# Patient Record
Sex: Male | Born: 2000 | Race: Black or African American | Hispanic: No | Marital: Single | Smoking: Never smoker
Health system: Southern US, Community
[De-identification: ages and names within clinical notes are randomized; demographics above are authoritative.]

---
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
## 2022-08-22 ENCOUNTER — Emergency Department (HOSPITAL_BASED_OUTPATIENT_CLINIC_OR_DEPARTMENT_OTHER)
Admission: EM | Admit: 2022-08-22 | Discharge: 2022-08-22 | Disposition: A | Discharge status: Home / Self Care | Payer: Medicaid Other | Attending: Emergency Medicine | Admitting: Emergency Medicine

## 2022-08-22 ENCOUNTER — Emergency Department (HOSPITAL_BASED_OUTPATIENT_CLINIC_OR_DEPARTMENT_OTHER): Payer: Medicaid Other

## 2022-08-22 ENCOUNTER — Other Ambulatory Visit: Payer: Self-pay

## 2022-08-22 ENCOUNTER — Encounter (HOSPITAL_BASED_OUTPATIENT_CLINIC_OR_DEPARTMENT_OTHER): Payer: Self-pay | Admitting: *Deleted

## 2022-08-22 DIAGNOSIS — R8289 Other abnormal findings on cytological and histological examination of urine: Secondary | ICD-10-CM | POA: Insufficient documentation

## 2022-08-22 DIAGNOSIS — M546 Pain in thoracic spine: Secondary | ICD-10-CM | POA: Diagnosis not present

## 2022-08-22 DIAGNOSIS — R1011 Right upper quadrant pain: Secondary | ICD-10-CM | POA: Insufficient documentation

## 2022-08-22 DIAGNOSIS — M25511 Pain in right shoulder: Secondary | ICD-10-CM | POA: Insufficient documentation

## 2022-08-22 LAB — CBC
HCT: 39.6 % (ref 39.0–52.0)
Hemoglobin: 13.4 g/dL (ref 13.0–17.0)
MCH: 28.7 pg (ref 26.0–34.0)
MCHC: 33.8 g/dL (ref 30.0–36.0)
MCV: 84.8 fL (ref 80.0–100.0)
Platelets: 194 10*3/uL (ref 150–400)
RBC: 4.67 MIL/uL (ref 4.22–5.81)
RDW: 12.9 % (ref 11.5–15.5)
WBC: 7.4 10*3/uL (ref 4.0–10.5)
nRBC: 0 % (ref 0.0–0.2)

## 2022-08-22 LAB — LIPASE, BLOOD: Lipase: 14 U/L (ref 11–51)

## 2022-08-22 LAB — COMPREHENSIVE METABOLIC PANEL
ALT: 5 U/L (ref 0–44)
AST: 14 U/L — ABNORMAL LOW (ref 15–41)
Albumin: 4.8 g/dL (ref 3.5–5.0)
Alkaline Phosphatase: 62 U/L (ref 38–126)
Anion gap: 11 (ref 5–15)
BUN: 10 mg/dL (ref 6–20)
CO2: 28 mmol/L (ref 22–32)
Calcium: 10.1 mg/dL (ref 8.9–10.3)
Chloride: 102 mmol/L (ref 98–111)
Creatinine, Ser: 0.9 mg/dL (ref 0.61–1.24)
GFR, Estimated: >60 mL/min (ref >60)
Glucose, Bld: 81 mg/dL (ref 70–99)
Potassium: 3.8 mmol/L (ref 3.5–5.1)
Sodium: 141 mmol/L (ref 135–145)
Total Bilirubin: 0.7 mg/dL (ref 0.3–1.2)
Total Protein: 8.2 g/dL — ABNORMAL HIGH (ref 6.5–8.1)

## 2022-08-22 LAB — URINALYSIS, ROUTINE W REFLEX MICROSCOPIC
Bilirubin Urine: NEGATIVE
Glucose, UA: NEGATIVE mg/dL
Ketones, ur: 15 mg/dL — AB
Leukocytes,Ua: NEGATIVE
Nitrite: NEGATIVE
Protein, ur: 30 mg/dL — AB
Specific Gravity, Urine: 1.033 — ABNORMAL HIGH (ref 1.005–1.030)
pH: 6.5 (ref 5.0–8.0)

## 2022-08-22 MED ORDER — IBUPROFEN 600 MG PO TABS: 600.0000 mg | ORAL_TABLET | Freq: Four times a day (QID) | ORAL | 0 refills | Status: AC | PRN

## 2022-08-22 MED ORDER — METHOCARBAMOL 500 MG PO TABS: 1000.0000 mg | ORAL_TABLET | Freq: Four times a day (QID) | ORAL | 0 refills | Status: DC

## 2022-08-22 NOTE — Discharge Instructions (Addendum)
Please read and follow all provided instructions.  Your diagnoses today include:  1. Abdominal pain, right upper quadrant   2. Acute pain of right shoulder     Tests performed today include: Blood cell counts and platelets Kidney and liver function tests Pancreas function test (called lipase) Urine test to look for infection Gallbladder ultrasound: was normal Vital signs. See below for your results today.   Medications prescribed:  Ibuprofen (Motrin, Advil) - anti-inflammatory pain medication Do not exceed 600mg  ibuprofen every 6 hours, take with food  You have been prescribed an anti-inflammatory medication or NSAID. Take with food. Take smallest effective dose for the shortest duration needed for your pain. Stop taking if you experience stomach pain or vomiting.   Robaxin (methocarbamol) - muscle relaxer medication  DO NOT drive or perform any activities that require you to be awake and alert because this medicine can make you drowsy.   Take any prescribed medications only as directed.  Home care instructions:  Follow any educational materials contained in this packet.  Follow-up instructions: Please follow-up with your primary care provider in the next 7 days for further evaluation of your symptoms.    Return instructions:  SEEK IMMEDIATE MEDICAL ATTENTION IF: The pain does not go away or becomes severe  A temperature above 101F develops  Repeated vomiting occurs (multiple episodes)  The pain becomes localized to portions of the abdomen. The right side could possibly be appendicitis. In an adult, the left lower portion of the abdomen could be colitis or diverticulitis.  Blood is being passed in stools or vomit (bright red or black tarry stools)  You develop chest pain, difficulty breathing, dizziness or fainting, or become confused, poorly responsive, or inconsolable (young children) If you have any other emergent concerns regarding your health  Additional  Information: Abdominal (belly) pain can be caused by many things. Your caregiver performed an examination and possibly ordered blood/urine tests and imaging (CT scan, x-rays, ultrasound). Many cases can be observed and treated at home after initial evaluation in the emergency department. Even though you are being discharged home, abdominal pain can be unpredictable. Therefore, you need a repeated exam if your pain does not resolve, returns, or worsens. Most patients with abdominal pain don't have to be admitted to the hospital or have surgery, but serious problems like appendicitis and gallbladder attacks can start out as nonspecific pain. Many abdominal conditions cannot be diagnosed in one visit, so follow-up evaluations are very important.  Your vital signs today were: BP 107/71 (BP Location: Right Arm)   Pulse 79   Temp 98.6 F (37 C) (Oral)   Resp 13   SpO2 99%  If your blood pressure (bp) was elevated above 135/85 this visit, please have this repeated by your doctor within one month. --------------

## 2022-08-22 NOTE — ED Triage Notes (Signed)
RUQ abdominal pain with radiation to back which increases with certain movements.  Pain began Sunday. No nausea or vomiting or diarrhea.  Pain radiates into right shoulder

## 2022-08-22 NOTE — ED Provider Notes (Signed)
[facility] EMERGENCY DEPT Provider Note   CSN: [id number] Arrival date & time: 08/22/22  1237     History  Chief Complaint  Patient presents with   Abdominal Pain    Chris Bennett is a 21 y.o. male.  Patient with no past surgical history presents to the emergency department for evaluation of right upper quadrant abdominal pain, shoulder pain, and right mid back pain starting 2 days ago.  He reports that the pain started while he was playing videogames.  He works in a [employer] but denies acute injuries.  The pain is progressively worsened.  It is worsened with certain positions or movements, however not really with palpation.  He denies associated chest pain or shortness of breath.  No fevers or cough.  No nausea, vomiting, diarrhea.  Denies family history of gallbladder issues.       Home Medications Prior to Admission medications   Medication Sig Start Date End Date Taking? Authorizing Provider  amoxicillin-clavulanate (AUGMENTIN) 875-125 MG tablet Take 1 tablet by mouth every 12 (twelve) hours. Patient not taking: Reported on 08/22/2022 01/08/18   Mardella Layman, MD      Allergies    Patient has no known allergies.    Review of Systems   Review of Systems  Physical Exam Updated Vital Signs BP 120/72 (BP Location: Right Arm)   Pulse 94   Temp (!) 97.5 F (36.4 C)   Resp 12   SpO2 98%   Physical Exam Vitals and nursing note reviewed.  Constitutional:      General: He is not in acute distress.    Appearance: He is well-developed.  HENT:     Head: Normocephalic and atraumatic.  Eyes:     General:        Right eye: No discharge.        Left eye: No discharge.     Conjunctiva/sclera: Conjunctivae normal.  Cardiovascular:     Rate and Rhythm: Normal rate and regular rhythm.     Heart sounds: Normal heart sounds.  Pulmonary:     Effort: Pulmonary effort is normal.     Breath sounds: Normal breath sounds.  Abdominal:     General: There is no  distension.     Palpations: Abdomen is soft.     Tenderness: There is abdominal tenderness (Mild) in the right upper quadrant. There is no guarding or rebound. Negative signs include Murphy's sign and McBurney's sign.  Musculoskeletal:     Cervical back: Normal range of motion and neck supple.     Comments: There are certain movements or positions in which the pain is worse for the patient, for instance when he lies back he states that he feels it more.  Pain is not really worse with palpation over the shoulder or scapular area where he reports feeling the pain.  No midline cervical or thoracic tenderness.  Skin:    General: Skin is warm and dry.  Neurological:     Mental Status: He is alert.     ED Results / Procedures / Treatments   Labs (all labs ordered are listed, but only abnormal results are displayed) Labs Reviewed  COMPREHENSIVE METABOLIC PANEL - Abnormal; Notable for the following components:      Result Value   Total Protein 8.2 (*)    AST 14 (*)    All other components within normal limits  URINALYSIS, ROUTINE W REFLEX MICROSCOPIC - Abnormal; Notable for the following components:   Specific Gravity, Urine 1.033 (*)  Hgb urine dipstick TRACE (*)    Ketones, ur 15 (*)    Protein, ur 30 (*)    All other components within normal limits  LIPASE, BLOOD  CBC    EKG None  Radiology US Abdomen Limited RUQ (LIVER/GB)  Result Date: 08/22/2022 CLINICAL DATA:  Right upper quadrant pain EXAM: ULTRASOUND ABDOMEN LIMITED RIGHT UPPER QUADRANT COMPARISON:  None Available. FINDINGS: Gallbladder: No gallstones or wall thickening visualized. No sonographic Murphy sign noted by sonographer. Common bile duct: Diameter: 3.5 mm Liver: No focal lesion identified. Within normal limits in parenchymal echogenicity. Portal vein is patent on color Doppler imaging with normal direction of blood flow towards the liver. Other: None. IMPRESSION: Negative examination Electronically Signed   By: Jasmine Pang M.D.   On: 08/22/2022 18:16    Procedures Procedures    Medications Ordered in ED Medications - No data to display  ED Course/ Medical Decision Making/ A&P    Patient seen and examined. History obtained directly from patient. Work-up including labs, imaging, EKG ordered in triage, if performed, were reviewed.    Labs/EKG: Independently reviewed and interpreted.  This included: CBC unremarkable; CMP unremarkable; lipase unremarkable; UA elevated specific gravity, no compelling signs of infection.  Imaging: Ordered right upper quadrant ultrasound.  Medications/Fluids: None ordered  Most recent vital signs reviewed and are as follows: BP 120/72 (BP Location: Right Arm)   Pulse 94   Temp (!) 97.5 F (36.4 C)   Resp 12   SpO2 98%   Initial impression: Patient with right upper quadrant abdominal pain with radiation to the shoulder.  This may be musculoskeletal in nature, but will rule out gallbladder etiology.  Labs are very reassuring.  If ultrasound negative, plan treatment with anti-inflammatories and muscle relaxer.  6:40 PM Reassessment performed. Patient appears stable, comfortable.  Exam unchanged.  Imaging personally visualized and interpreted including: Right upper quadrant ultrasound, agree negative.  Reviewed pertinent lab work and imaging with patient at bedside. Questions answered.   Most current vital signs reviewed and are as follows: BP 107/71 (BP Location: Right Arm)   Pulse 79   Temp 98.6 F (37 C) (Oral)   Resp 13   SpO2 99%   Plan: Discharge to home.   Prescriptions written for: Ibuprofen, Robaxin  Patient counseled on proper use of muscle relaxant medication.  They were told not to drink alcohol, drive any vehicle, or do any dangerous activities while taking this medication.  Patient verbalized understanding.  Other home care instructions discussed: Rest, no heavy lifting or pushing or pulling.  ED return instructions discussed: The patient  was urged to return to the Emergency Department immediately with worsening of current symptoms, worsening abdominal pain, persistent vomiting, blood noted in stools, fever, or any other concerns. The patient verbalized understanding.   Follow-up instructions discussed: Patient encouraged to follow-up with their PCP in 7 days.                           Medical Decision Making Amount and/or Complexity of Data Reviewed Labs: ordered. Radiology: ordered.   For this patient's complaint of abdominal pain, the following conditions were considered on the differential diagnosis: gastritis/PUD, enteritis/duodenitis, appendicitis, cholelithiasis/cholecystitis, cholangitis, pancreatitis, ruptured viscus, colitis, diverticulitis, small/large bowel obstruction, proctitis, cystitis, pyelonephritis, ureteral colic, aortic dissection, aortic aneurysm. Atypical chest etiologies were also considered including ACS, PE, and pneumonia.   Most likely differential at this point is musculoskeletal pain, given pain with certain positions.  Abdominal work-up today is reassuring.  The patient's vital signs, pertinent lab work and imaging were reviewed and interpreted as discussed in the ED course. Hospitalization was considered for further testing, treatments, or serial exams/observation. However as patient is well-appearing, has a stable exam, and reassuring studies today, I do not feel that they warrant admission at this time. This plan was discussed with the patient who verbalizes agreement and comfort with this plan and seems reliable and able to return to the Emergency Department with worsening or changing symptoms.          Final Clinical Impression(s) / ED Diagnoses Final diagnoses:  Abdominal pain, right upper quadrant  Acute pain of right shoulder    Rx / DC Orders ED Discharge Orders          Ordered    methocarbamol (ROBAXIN) 500 MG tablet  4 times daily        08/22/22 1843    ibuprofen (ADVIL)  600 MG tablet  Every 6 hours PRN        08/22/22 1843              Renne Crigler, PA-C 08/22/22 1843    Rondel Baton, MD 08/23/22 1145

## 2023-09-04 ENCOUNTER — Emergency Department (HOSPITAL_BASED_OUTPATIENT_CLINIC_OR_DEPARTMENT_OTHER)
Admission: EM | Admit: 2023-09-04 | Discharge: 2023-09-04 | Disposition: A | Discharge status: Home / Self Care | Payer: No Typology Code available for payment source

## 2023-09-04 ENCOUNTER — Encounter (HOSPITAL_BASED_OUTPATIENT_CLINIC_OR_DEPARTMENT_OTHER): Payer: Self-pay | Admitting: Emergency Medicine

## 2023-09-04 ENCOUNTER — Other Ambulatory Visit: Payer: Self-pay

## 2023-09-04 DIAGNOSIS — M549 Dorsalgia, unspecified: Secondary | ICD-10-CM | POA: Diagnosis not present

## 2023-09-04 DIAGNOSIS — R519 Headache, unspecified: Secondary | ICD-10-CM | POA: Insufficient documentation

## 2023-09-04 DIAGNOSIS — Y92410 Unspecified street and highway as the place of occurrence of the external cause: Secondary | ICD-10-CM | POA: Diagnosis not present

## 2023-09-04 MED ORDER — METHOCARBAMOL 500 MG PO TABS: 500.0000 mg | ORAL_TABLET | Freq: Two times a day (BID) | ORAL | 0 refills | Status: AC

## 2023-09-04 MED ORDER — NAPROXEN 375 MG PO TABS: 375.0000 mg | ORAL_TABLET | Freq: Two times a day (BID) | ORAL | 0 refills | Status: AC

## 2023-09-04 NOTE — ED Notes (Signed)
Reviewed AVS/discharge instruction with patient. Time allotted for and all questions answered. Patient is agreeable for d/c and escorted to ed exit by staff.  
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
## 2023-09-04 NOTE — Discharge Instructions (Addendum)
I have prescribed a short course of anti-inflammatories to help with your pain.  Please take 1 tablet twice a day with food for the next 7 days.  In addition, you were given a prescription for muscle relaxers, please take this medication as prescribed if needed.  Do not drink alcohol or drive while taking this medication.
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
